# Patient Record
Sex: Male | Born: 1935 | Marital: Married
Health system: Southern US, Community
[De-identification: ages and names within clinical notes are randomized; demographics above are authoritative.]

---

## 2003-01-05 ENCOUNTER — Other Ambulatory Visit: Payer: Self-pay

## 2004-04-30 ENCOUNTER — Ambulatory Visit: Payer: Self-pay

## 2004-05-18 ENCOUNTER — Ambulatory Visit: Payer: Self-pay

## 2004-05-24 ENCOUNTER — Ambulatory Visit: Payer: Self-pay | Admitting: Internal Medicine

## 2004-09-24 ENCOUNTER — Ambulatory Visit: Payer: Self-pay

## 2004-12-12 ENCOUNTER — Inpatient Hospital Stay: Payer: Self-pay | Admitting: Cardiology

## 2004-12-12 ENCOUNTER — Other Ambulatory Visit: Payer: Self-pay

## 2004-12-13 ENCOUNTER — Other Ambulatory Visit: Payer: Self-pay

## 2005-04-19 ENCOUNTER — Ambulatory Visit: Payer: Self-pay | Admitting: Specialist

## 2005-09-10 ENCOUNTER — Ambulatory Visit: Payer: Self-pay | Admitting: Internal Medicine

## 2005-10-29 ENCOUNTER — Ambulatory Visit: Payer: Self-pay | Admitting: Specialist

## 2006-05-01 ENCOUNTER — Ambulatory Visit: Payer: Self-pay | Admitting: Specialist

## 2009-02-10 ENCOUNTER — Inpatient Hospital Stay: Payer: Self-pay | Admitting: Internal Medicine

## 2009-04-14 ENCOUNTER — Ambulatory Visit: Payer: Self-pay | Admitting: Internal Medicine

## 2009-08-19 ENCOUNTER — Emergency Department: Payer: Self-pay | Admitting: Internal Medicine

## 2009-11-24 ENCOUNTER — Ambulatory Visit: Payer: Self-pay | Admitting: Internal Medicine

## 2011-12-05 ENCOUNTER — Encounter: Payer: Self-pay | Admitting: Internal Medicine

## 2011-12-20 ENCOUNTER — Encounter: Payer: Self-pay | Admitting: Internal Medicine

## 2012-01-19 ENCOUNTER — Encounter: Payer: Self-pay | Admitting: Internal Medicine

## 2012-02-19 ENCOUNTER — Encounter: Payer: Self-pay | Admitting: Internal Medicine

## 2012-05-23 ENCOUNTER — Inpatient Hospital Stay: Payer: Self-pay | Admitting: Internal Medicine

## 2012-05-23 LAB — CK TOTAL AND CKMB (NOT AT ARMC)
CK, Total: 37 U/L (ref 35–232)
CK, Total: 29 U/L — ABNORMAL LOW (ref 35–232)
CK-MB: 0.7 ng/mL (ref 0.5–3.6)
CK-MB: 0.5 ng/mL (ref 0.5–3.6)
CK-MB: 0.6 ng/mL (ref 0.5–3.6)

## 2012-05-23 LAB — COMPREHENSIVE METABOLIC PANEL
Alkaline Phosphatase: 72 U/L (ref 50–136)
Anion Gap: 5 — ABNORMAL LOW (ref 7–16)
BUN: 19 mg/dL — ABNORMAL HIGH (ref 7–18)
Calcium, Total: 8.5 mg/dL (ref 8.5–10.1)
Co2: 28 mmol/L (ref 21–32)
Creatinine: 0.96 mg/dL (ref 0.60–1.30)
EGFR (African American): >60
EGFR (Non-African Amer.): >60
SGOT(AST): 24 U/L (ref 15–37)
Sodium: 138 mmol/L (ref 136–145)

## 2012-05-23 LAB — CBC WITH DIFFERENTIAL/PLATELET
Basophil #: 0 10*3/uL (ref 0.0–0.1)
Eosinophil %: 1.5 %
HCT: 37.2 % — ABNORMAL LOW (ref 40.0–52.0)
Lymphocyte #: 1.6 10*3/uL (ref 1.0–3.6)
MCHC: 33.4 g/dL (ref 32.0–36.0)
MCV: 96 fL (ref 80–100)
Monocyte %: 9.8 %
Neutrophil #: 7.2 10*3/uL — ABNORMAL HIGH (ref 1.4–6.5)
Platelet: 288 10*3/uL (ref 150–440)
RDW: 14.2 % (ref 11.5–14.5)

## 2012-05-23 LAB — TROPONIN I
Troponin-I: 0.02 ng/mL
Troponin-I: <0.02 ng/mL

## 2012-05-24 LAB — BASIC METABOLIC PANEL
Anion Gap: 4 — ABNORMAL LOW (ref 7–16)
BUN: 27 mg/dL — ABNORMAL HIGH (ref 7–18)
Chloride: 105 mmol/L (ref 98–107)
Co2: 30 mmol/L (ref 21–32)
Glucose: 175 mg/dL — ABNORMAL HIGH (ref 65–99)
Osmolality: 287 (ref 275–301)

## 2012-05-25 LAB — BASIC METABOLIC PANEL
Anion Gap: 5 — ABNORMAL LOW (ref 7–16)
BUN: 29 mg/dL — ABNORMAL HIGH (ref 7–18)
Calcium, Total: 9.3 mg/dL (ref 8.5–10.1)
Chloride: 104 mmol/L (ref 98–107)
Co2: 30 mmol/L (ref 21–32)
Creatinine: 0.98 mg/dL (ref 0.60–1.30)
Glucose: 186 mg/dL — ABNORMAL HIGH (ref 65–99)
Potassium: 3.9 mmol/L (ref 3.5–5.1)

## 2012-05-26 LAB — BASIC METABOLIC PANEL
Calcium, Total: 9 mg/dL (ref 8.5–10.1)
Co2: 30 mmol/L (ref 21–32)
Creatinine: 1.2 mg/dL (ref 0.60–1.30)
Osmolality: 286 (ref 275–301)
Potassium: 4.1 mmol/L (ref 3.5–5.1)

## 2012-05-27 ENCOUNTER — Encounter: Payer: Self-pay | Admitting: Internal Medicine

## 2012-05-31 ENCOUNTER — Emergency Department: Payer: Self-pay | Admitting: Emergency Medicine

## 2012-05-31 LAB — URINALYSIS, COMPLETE
Bacteria: NONE SEEN
Bilirubin,UR: NEGATIVE
Blood: NEGATIVE
Ketone: NEGATIVE
Nitrite: NEGATIVE
Ph: 5 (ref 4.5–8.0)
RBC,UR: 1 /HPF (ref 0–5)
Specific Gravity: 1.018 (ref 1.003–1.030)
Squamous Epithelial: NONE SEEN

## 2012-06-10 LAB — URINALYSIS, COMPLETE
Bacteria: NONE SEEN
Bilirubin,UR: NEGATIVE
Blood: NEGATIVE
Glucose,UR: 50 mg/dL (ref 0–75)
Leukocyte Esterase: NEGATIVE
Ph: 5 (ref 4.5–8.0)
RBC,UR: <1 /HPF (ref 0–5)
Specific Gravity: 1.017 (ref 1.003–1.030)
Squamous Epithelial: <1
WBC UR: <1 /HPF (ref 0–5)

## 2012-06-18 ENCOUNTER — Encounter: Payer: Self-pay | Admitting: Internal Medicine

## 2013-05-17 ENCOUNTER — Ambulatory Visit: Payer: Self-pay | Admitting: Cardiology

## 2013-05-17 DIAGNOSIS — I509 Heart failure, unspecified: Secondary | ICD-10-CM

## 2013-05-17 DIAGNOSIS — I2510 Atherosclerotic heart disease of native coronary artery without angina pectoris: Secondary | ICD-10-CM

## 2013-05-17 LAB — CBC WITH DIFFERENTIAL/PLATELET
BASOS ABS: 0 10*3/uL (ref 0.0–0.1)
BASOS PCT: 0.6 %
EOS ABS: 0.1 10*3/uL (ref 0.0–0.7)
EOS PCT: 0.8 %
HCT: 40 % (ref 40.0–52.0)
HGB: 13 g/dL (ref 13.0–18.0)
LYMPHS ABS: 1.3 10*3/uL (ref 1.0–3.6)
Lymphocyte %: 16.6 %
MCH: 31 pg (ref 26.0–34.0)
MCHC: 32.5 g/dL (ref 32.0–36.0)
MCV: 95 fL (ref 80–100)
MONOS PCT: 9.1 %
Monocyte #: 0.7 x10 3/mm (ref 0.2–1.0)
Neutrophil #: 5.8 10*3/uL (ref 1.4–6.5)
Neutrophil %: 72.9 %
Platelet: 240 10*3/uL (ref 150–440)
RBC: 4.2 10*6/uL — AB (ref 4.40–5.90)
RDW: 14.7 % — AB (ref 11.5–14.5)
WBC: 8 10*3/uL (ref 3.8–10.6)

## 2013-05-17 LAB — BASIC METABOLIC PANEL
Anion Gap: 0 — ABNORMAL LOW (ref 7–16)
BUN: 21 mg/dL — AB (ref 7–18)
Calcium, Total: 9.2 mg/dL (ref 8.5–10.1)
Chloride: 105 mmol/L (ref 98–107)
Co2: 33 mmol/L — ABNORMAL HIGH (ref 21–32)
Creatinine: 1.03 mg/dL (ref 0.60–1.30)
EGFR (African American): >60
EGFR (Non-African Amer.): >60
GLUCOSE: 99 mg/dL (ref 65–99)
OSMOLALITY: 279 (ref 275–301)
Potassium: 4.1 mmol/L (ref 3.5–5.1)
SODIUM: 138 mmol/L (ref 136–145)

## 2013-05-17 LAB — PROTIME-INR
INR: 1.1
Prothrombin Time: 14.1 secs (ref 11.5–14.7)

## 2013-05-17 LAB — APTT: ACTIVATED PTT: 39.1 s — AB (ref 23.6–35.9)

## 2013-05-18 ENCOUNTER — Ambulatory Visit: Payer: Self-pay | Admitting: Cardiology

## 2013-05-19 ENCOUNTER — Ambulatory Visit: Payer: Self-pay | Admitting: Hospice and Palliative Medicine

## 2013-05-23 ENCOUNTER — Observation Stay: Payer: Self-pay | Admitting: Specialist

## 2013-05-23 LAB — CBC
HCT: 38.8 % — ABNORMAL LOW (ref 40.0–52.0)
HGB: 12.8 g/dL — ABNORMAL LOW (ref 13.0–18.0)
MCH: 31.4 pg (ref 26.0–34.0)
MCHC: 33.1 g/dL (ref 32.0–36.0)
MCV: 95 fL (ref 80–100)
Platelet: 203 10*3/uL (ref 150–440)
RBC: 4.09 10*6/uL — AB (ref 4.40–5.90)
RDW: 14.9 % — ABNORMAL HIGH (ref 11.5–14.5)
WBC: 7.3 10*3/uL (ref 3.8–10.6)

## 2013-05-23 LAB — URINALYSIS, COMPLETE
BACTERIA: NONE SEEN
Bilirubin,UR: NEGATIVE
Blood: NEGATIVE
Glucose,UR: 50 mg/dL (ref 0–75)
KETONE: NEGATIVE
Leukocyte Esterase: NEGATIVE
Nitrite: NEGATIVE
Ph: 5 (ref 4.5–8.0)
RBC,UR: 2 /HPF (ref 0–5)
SPECIFIC GRAVITY: 1.026 (ref 1.003–1.030)
WBC UR: 2 /HPF (ref 0–5)

## 2013-05-23 LAB — BASIC METABOLIC PANEL
Anion Gap: 4 — ABNORMAL LOW (ref 7–16)
BUN: 17 mg/dL (ref 7–18)
CALCIUM: 8.6 mg/dL (ref 8.5–10.1)
CHLORIDE: 106 mmol/L (ref 98–107)
CREATININE: 0.95 mg/dL (ref 0.60–1.30)
Co2: 30 mmol/L (ref 21–32)
EGFR (African American): >60
EGFR (Non-African Amer.): >60
Glucose: 170 mg/dL — ABNORMAL HIGH (ref 65–99)
Osmolality: 285 (ref 275–301)
Potassium: 3.6 mmol/L (ref 3.5–5.1)
Sodium: 140 mmol/L (ref 136–145)

## 2013-05-23 LAB — TROPONIN I: TROPONIN-I: 0.05 ng/mL

## 2013-05-23 LAB — TSH: Thyroid Stimulating Horm: 1.98 u[IU]/mL

## 2013-05-24 ENCOUNTER — Ambulatory Visit: Payer: Self-pay | Admitting: Neurology

## 2013-06-08 ENCOUNTER — Inpatient Hospital Stay: Payer: Self-pay | Admitting: Internal Medicine

## 2013-06-08 LAB — CBC
HCT: 38.5 % — AB (ref 40.0–52.0)
HGB: 12.5 g/dL — ABNORMAL LOW (ref 13.0–18.0)
MCH: 30.9 pg (ref 26.0–34.0)
MCHC: 32.4 g/dL (ref 32.0–36.0)
MCV: 95 fL (ref 80–100)
PLATELETS: 277 10*3/uL (ref 150–440)
RBC: 4.04 10*6/uL — ABNORMAL LOW (ref 4.40–5.90)
RDW: 14.5 % (ref 11.5–14.5)
WBC: 11.9 10*3/uL — ABNORMAL HIGH (ref 3.8–10.6)

## 2013-06-08 LAB — BASIC METABOLIC PANEL
Anion Gap: 4 — ABNORMAL LOW (ref 7–16)
BUN: 17 mg/dL (ref 7–18)
CALCIUM: 8.7 mg/dL (ref 8.5–10.1)
CO2: 28 mmol/L (ref 21–32)
Chloride: 108 mmol/L — ABNORMAL HIGH (ref 98–107)
Creatinine: 0.82 mg/dL (ref 0.60–1.30)
EGFR (Non-African Amer.): >60
Glucose: 134 mg/dL — ABNORMAL HIGH (ref 65–99)
Osmolality: 283 (ref 275–301)
POTASSIUM: 3.6 mmol/L (ref 3.5–5.1)
Sodium: 140 mmol/L (ref 136–145)

## 2013-06-08 LAB — TROPONIN I
TROPONIN-I: 0.06 ng/mL — AB
Troponin-I: 0.06 ng/mL — ABNORMAL HIGH

## 2013-06-08 LAB — URINALYSIS, COMPLETE
BLOOD: NEGATIVE
Glucose,UR: NEGATIVE mg/dL (ref 0–75)
KETONE: NEGATIVE
Leukocyte Esterase: NEGATIVE
NITRITE: NEGATIVE
Ph: 5 (ref 4.5–8.0)
Protein: 30
RBC,UR: 3 /HPF (ref 0–5)
Specific Gravity: 1.029 (ref 1.003–1.030)
Squamous Epithelial: <1
WBC UR: 2 /HPF (ref 0–5)

## 2013-06-09 LAB — BASIC METABOLIC PANEL
ANION GAP: 4 — AB (ref 7–16)
BUN: 18 mg/dL (ref 7–18)
CALCIUM: 8.9 mg/dL (ref 8.5–10.1)
Chloride: 107 mmol/L (ref 98–107)
Co2: 30 mmol/L (ref 21–32)
Creatinine: 0.78 mg/dL (ref 0.60–1.30)
EGFR (African American): >60
EGFR (Non-African Amer.): >60
Glucose: 116 mg/dL — ABNORMAL HIGH (ref 65–99)
Osmolality: 284 (ref 275–301)
POTASSIUM: 3.8 mmol/L (ref 3.5–5.1)
SODIUM: 141 mmol/L (ref 136–145)

## 2013-06-09 LAB — CBC WITH DIFFERENTIAL/PLATELET
BASOS PCT: 0.8 %
Basophil #: 0.1 10*3/uL (ref 0.0–0.1)
Eosinophil #: 0.1 10*3/uL (ref 0.0–0.7)
Eosinophil %: 1.5 %
HCT: 37.6 % — AB (ref 40.0–52.0)
HGB: 12.6 g/dL — ABNORMAL LOW (ref 13.0–18.0)
LYMPHS ABS: 1 10*3/uL (ref 1.0–3.6)
LYMPHS PCT: 11 %
MCH: 31.8 pg (ref 26.0–34.0)
MCHC: 33.6 g/dL (ref 32.0–36.0)
MCV: 95 fL (ref 80–100)
MONOS PCT: 8.5 %
Monocyte #: 0.8 x10 3/mm (ref 0.2–1.0)
NEUTROS PCT: 78.2 %
Neutrophil #: 7.4 10*3/uL — ABNORMAL HIGH (ref 1.4–6.5)
PLATELETS: 272 10*3/uL (ref 150–440)
RBC: 3.97 10*6/uL — AB (ref 4.40–5.90)
RDW: 14.6 % — AB (ref 11.5–14.5)
WBC: 9.5 10*3/uL (ref 3.8–10.6)

## 2013-06-09 LAB — TROPONIN I: Troponin-I: 0.06 ng/mL — ABNORMAL HIGH

## 2013-06-13 LAB — CULTURE, BLOOD (SINGLE)

## 2013-06-18 ENCOUNTER — Ambulatory Visit: Payer: Self-pay | Admitting: Hospice and Palliative Medicine

## 2013-06-18 DEATH — deceased

## 2014-02-13 IMAGING — CT CT CERVICAL SPINE WITHOUT CONTRAST
2 series · 16 of 27 positions shown, 20 images · non-contrast
Comparison: none

REASON FOR EXAM: pain fall
COMMENTS:

PROCEDURE:     CT  - CT CERVICAL SPINE WO  - May 31, 2012  [DATE]
RESULT:     Comparison: None.
TECHNIQUE: Multiple axial CT images were obtained of the cervical spine,
without intravenous contrast.  Sagittal and coronal reformatted images were
constructed.

[Series 5: sagittal · sagittal · 0.37mm/px · 5 of 46 slices shown, 6 images]
[im 16/46  bone]
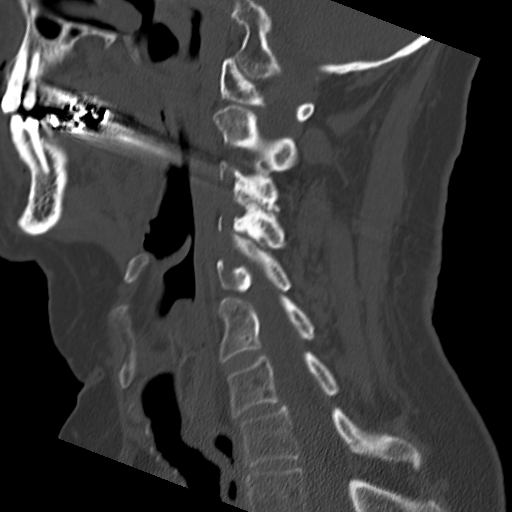
[im 19/46  bone]
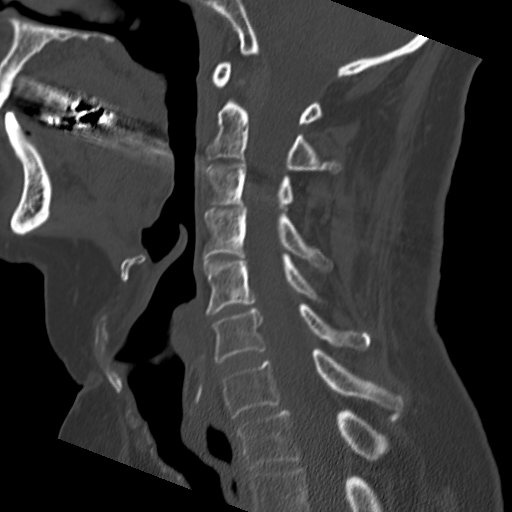
[im 23/46  soft-tissue]
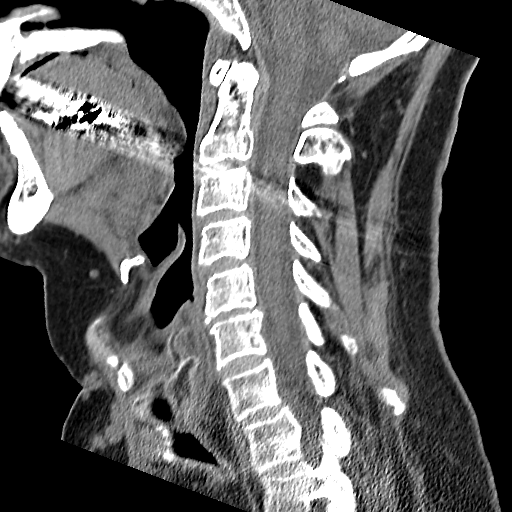
[im 23/46  bone]
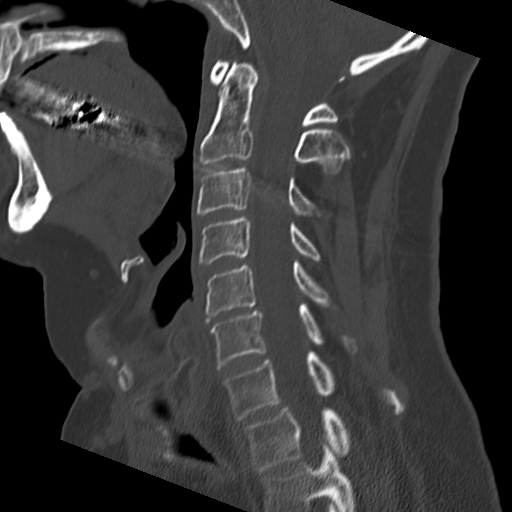
[im 27/46  bone]
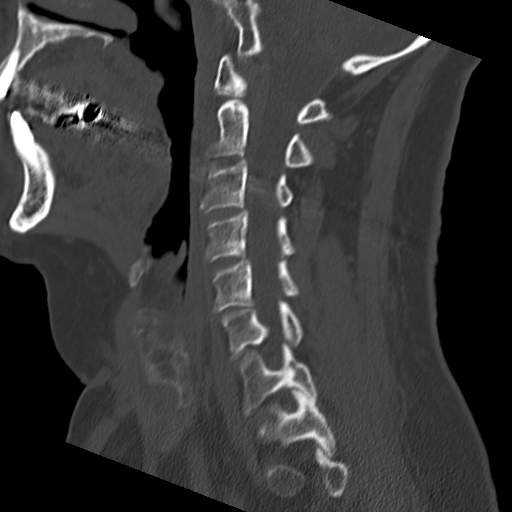
[im 31/46  bone]
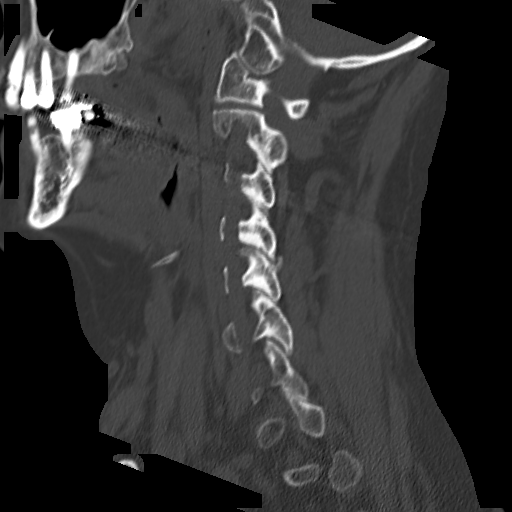

[Series 6: axial · axial · 0.33mm/px · z∈[-328,-169]mm · 11 of 100 slices shown, 14 images]
[im 8/100  soft-tissue]
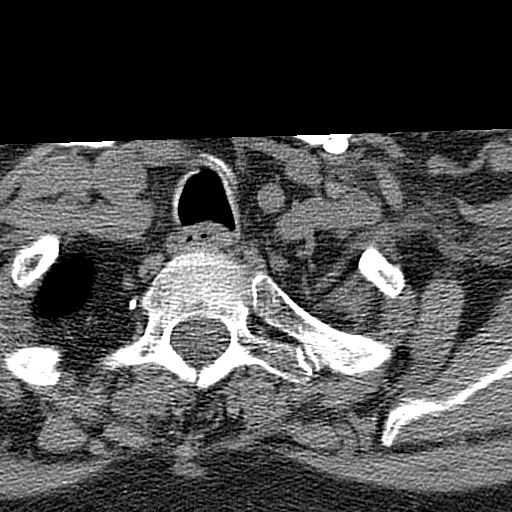
[im 8/100  bone]
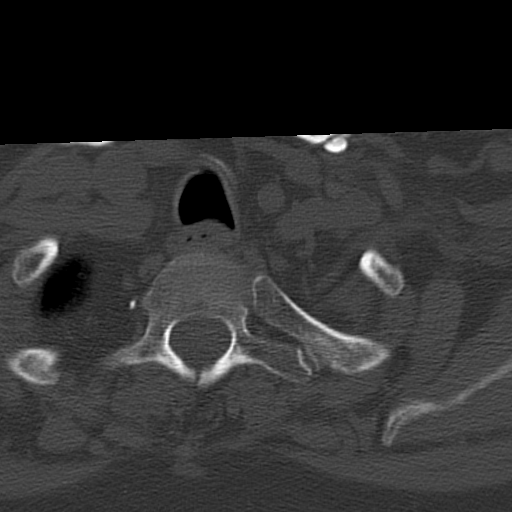
[im 16/100  bone]
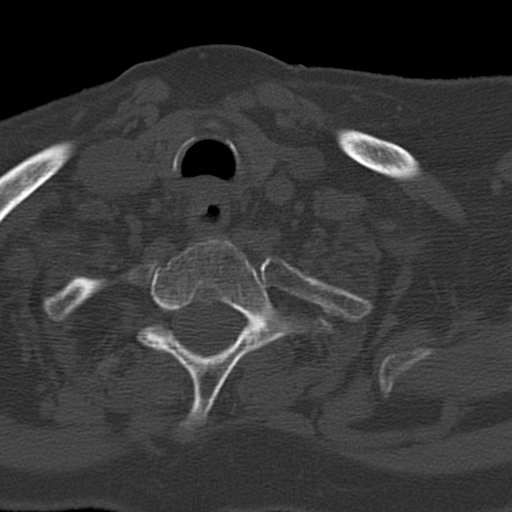
[im 23/100  bone]
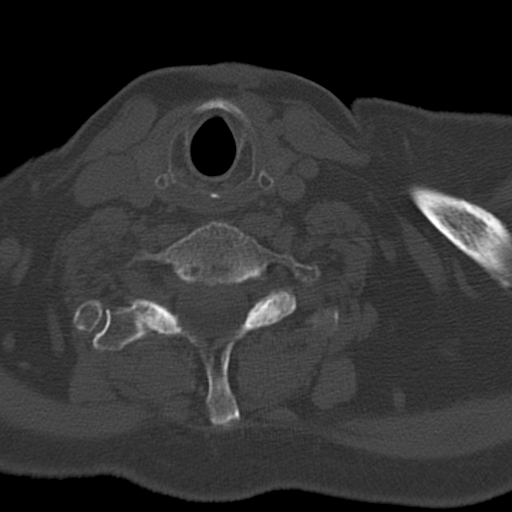
[im 31/100  bone]
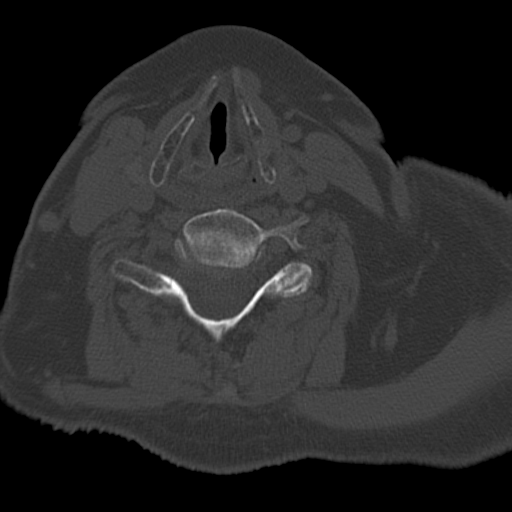
[im 39/100  soft-tissue]
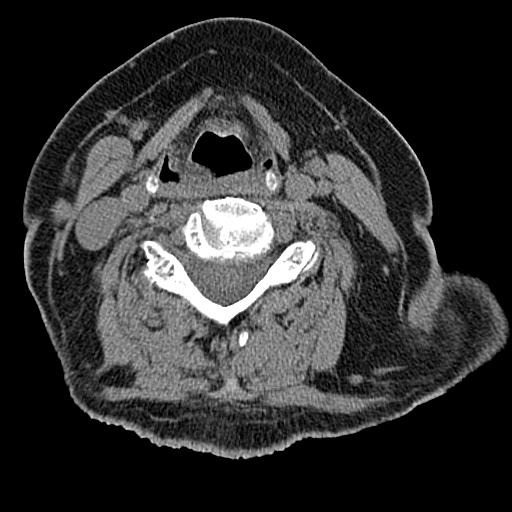
[im 39/100  bone]
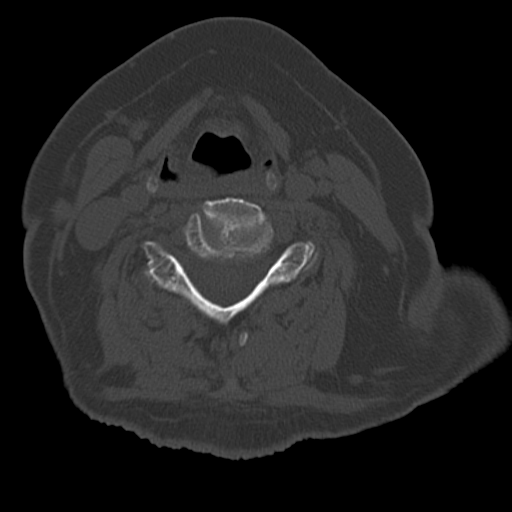
[im 54/100  bone]
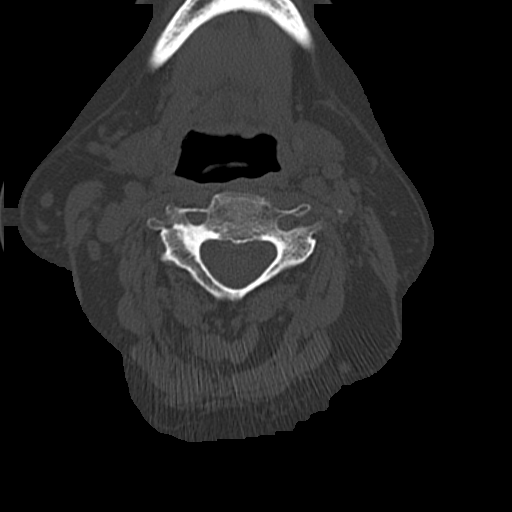
[im 61/100  bone]
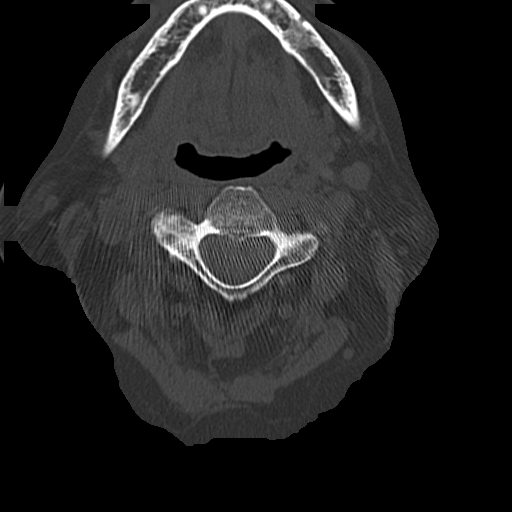
[im 69/100  bone]
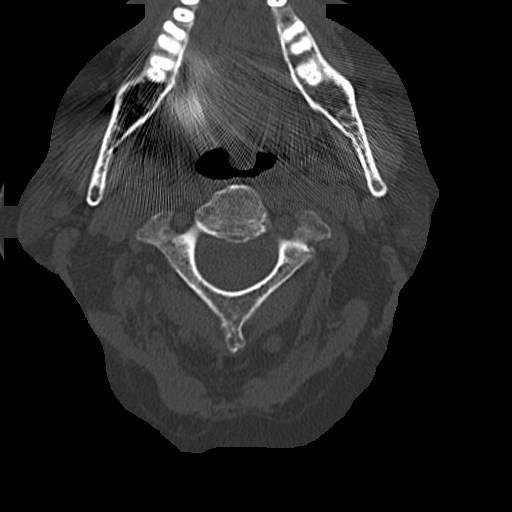
[im 77/100  soft-tissue]
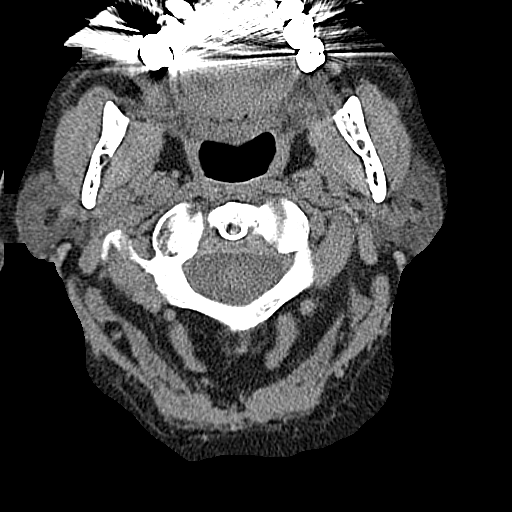
[im 77/100  bone]
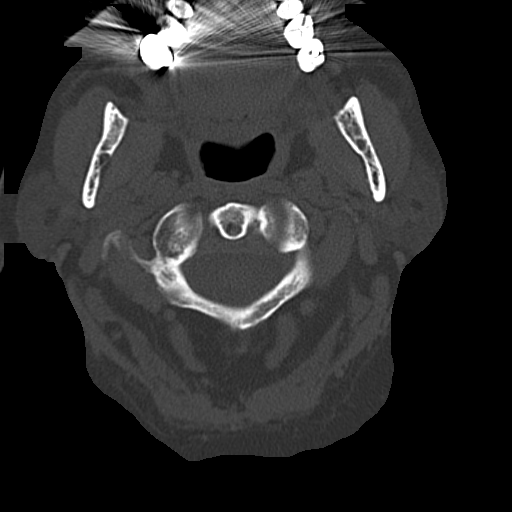
[im 84/100  bone]
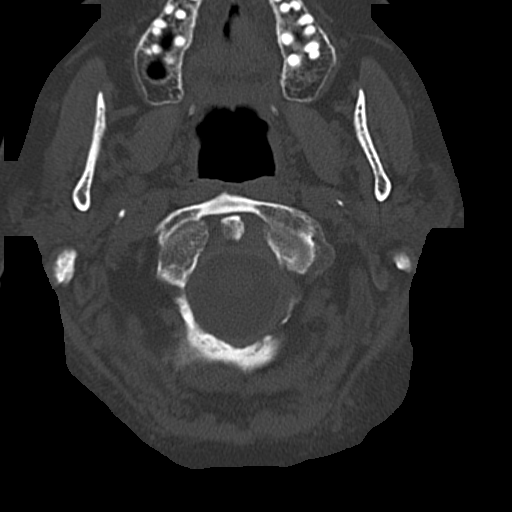
[im 92/100  bone]
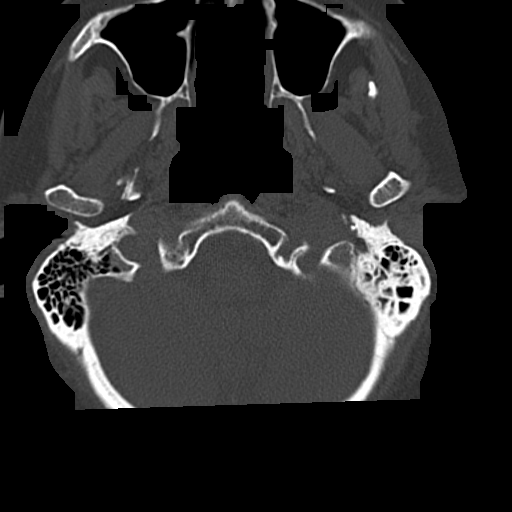

[16 of 27 positions shown; findings below may reference images not displayed]

FINDINGS: No cervical spine fracture seen. There is approximately 2 mm of
anterolisthesis of C7 on T1. Mild posterior disc bulge is seen at C6-C7.
Vertebral body heights are maintained.  Prevertebral soft tissues are within
normal limits.
IMPRESSION: 1. No cervical spine fracture seen.
2. Slight anterolisthesis of C7 on T[DATE] be degenerative. Ligamentous
injury is not excluded.

[REDACTED]

## 2014-06-10 NOTE — Consult Note (Signed)
Brief Consult Note: Diagnosis: Cough, mild elevated BNP c/w bronchitis or pneumonia, neg troponin.   Patient was seen by consultant.   Consult note dictated.   Comments: REC  Agree with current therapy, review echo, defer full dose anticoagulation, defer further cardiac diagnostics.  Electronic Signatures: Marcina MillardParaschos, Delano Scardino (MD)  (Signed 05-Apr-14 11:22)  Authored: Brief Consult Note   Last Updated: 05-Apr-14 11:22 by Marcina MillardParaschos, Sho Salguero (MD)

## 2014-06-10 NOTE — Consult Note (Signed)
PATIENT NAME:  Jose Santiago, Jose Santiago MR#:  [id number] DATE OF BIRTH:  10-Sep-1921  DATE OF CONSULTATION:  05/23/2012  PRIMARY CARE PHYSICIAN:   Randa LynnLamb CONSULTING PHYSICIAN:  Marcina MillardAlexander Dan Scearce, MD  CHIEF COMPLAINT:  Cough.  REASON FOR CONSULTATION: Consultation requested for evaluation of coronary artery disease.   HISTORY OF PRESENT ILLNESS: The patient is an 81 year old gentleman with known history of coronary artery disease status post bypass graft surgery. The patient is status post pacemaker for second-degree AV block. The patient has had a recent history of productive cough treated as an outpatient by Dr. Randa LynnLamb but presents to [facility] Emergency Room. Chest x-ray revealed possible developing pneumonia. BNP was elevated to 1800. The patient denies chest pain. Other notable admission labs included negative troponin 0.02   PAST MEDICAL HISTORY: 1.  Known coronary artery disease status post bypass graft surgery 1981.  2.  Status post pacemaker for second-degree AV block.  3. Obstructive sleep apnea on nocturnal oxygen therapy. 4.  History of hepatitis C after blood transfusion.  5.  Hyperlipidemia.   MEDICATIONS: Simvastatin 20 mg daily, metoprolol 25 mg b.i.d., Imdur 1 daily, hydrochlorothiazide 12.5 mg daily, Xanax p.r.n., omeprazole 20 mg daily.   SOCIAL HISTORY:  The patient denies tobacco or EtOH abuse.   FAMILY HISTORY:  No immediate family history of coronary artery disease or myocardial infarction.  REVIEW OF SYSTEMS:  CONSTITUTIONAL:  No fever or chills.  EYES:  No blurry vision.  EARS:  No hearing loss.   RESPIRATORY:  No shortness of breath. The patient does have cough.  CARDIOVASCULAR:  The patient denies chest pain.  GASTROINTESTINAL:  No nausea, vomiting or diarrhea.  GENITOURINARY:  No dysuria or hematuria.  ENDOCRINE:  No polyuria or polydipsia.  MUSCULOSKELETAL:  No arthralgias or myalgias.  NEUROLOGICAL:  No focal muscle weakness or numbness.  PSYCHOLOGICAL:  No depression  or anxiety.   PHYSICAL EXAMINATION: VITAL SIGNS: Blood pressure 125/73, pulse 75, respirations 18, pulse ox 98%.  HEENT:  Pupils equal, reactive to light and accommodation.  NECK:  Supple without thyromegaly.  LUNGS:  Clear.  CARDIOVASCULAR: Normal JVP. Normal PMI. Regular rate and rhythm. Normal S1, S2. No appreciable gallop, murmur or rub.  ABDOMEN: Soft and nontender. Pulses were intact bilaterally.  MUSCULOSKELETAL:  Normal muscle tone.  NEUROLOGIC:  The patient is alert and oriented x 3. Motor and sensory both grossly intact.   IMPRESSION:  An 81 year old gentleman with known coronary artery disease status post bypass graft surgery who presents with cough consistent with bronchitis or early pneumonia with negative troponin.   RECOMMENDATIONS: 1.  Continue present medications.  2.  Would defer full dose anticoagulation.  3.  Defer further cardiac workup at this time.     ____________________________ Marcina MillardAlexander Donnabelle Blanchard, MD ap:ce D: 05/23/2012 11:21:04 ET T: 05/23/2012 11:56:45 ET JOB#: [id number]  cc: Marcina MillardAlexander Analysa Nutting, MD, <Dictator> Marcina MillardALEXANDER Alvina Strother MD ELECTRONICALLY SIGNED 05/26/2012 12:40

## 2014-06-10 NOTE — H&P (Signed)
PATIENT NAME:  Jose Santiago, Jose Santiago MR#:  [id number] DATE OF BIRTH:  09/15/1921  DATE OF ADMISSION:  05/23/2012  PRIMARY CARE PHYSICIAN: Reola Mosher. Randa Lynn, MD  CHIEF COMPLAINT: Cough, shortness of breath.   HISTORY OF PRESENT ILLNESS: The patient is a pleasant 81 year old white male with a past medical history of coronary artery disease, status post CABG in 1981, obstructive sleep apnea, history of second-degree AV block, status post permanent pacemaker placement, with seasonal allergies, who presented to the Emergency Department with complaints of cough, shortness of breath for the last few days. The patient states unable to lie down flat, starts to have coughing spell to the point of passing out. Concerning this, contacted his primary care physician who felt this was from the seasonal allergies. Last night, the patient started to experience severe shortness of breath. Did not have any chest pain, palpitations. Did not have any fever. However, has been having yellowish-greenish sputum. Workup in the Emergency Department revealed has elevated BNP of 1800. The patient walks small distances around the house. Usually feels fatigued; the patient states no more than usual at this time. Chest x-ray showed coarse lung markings consistent with atelectasis or developing pneumonia.   PAST MEDICAL HISTORY:  1. Coronary artery disease, status post CABG in 1981.  2. Second-degree AV block, status post pacemaker placement.  3. Obstructive sleep apnea, on nocturnal oxygen 2 liters per minute.  4. History of HCV diagnosed after the blood transfusion.  5. Anxiety.  7. Constipation.   ALLERGIES: PENICILLIN.   HOME MEDICATIONS:  1. Xanax orally as needed.  2. Simvastatin 20 mg 1 tablet once a day.  3. Omeprazole 20 mg once a day.  4. Metoprolol 25 mg 2 times a day.  5. Imdur 1 tablet once a day.  6. Hydrochlorothiazide 12.5 mg once a day.   SOCIAL HISTORY: No history of smoking, drinking alcohol or using illicit  drugs.   FAMILY HISTORY: Negative for coronary artery disease and MI.  REVIEW OF SYSTEMS: CONSTITUTIONAL: Denies any fever. Feels generalized weakness secondary to lack of sleep in the last 2 to 3 days.  EYES: No blurred vision. Has discharge from the eyes.  ENT: Has mild sore throat, cough. Feels sinus congestion.   RESPIRATORY: Has cough, shortness of breath.  CARDIOVASCULAR: No chest pain, palpitations.  GASTROINTESTINAL: No nausea or vomiting.  GENITOURINARY: No dysuria or hematuria  ENDOCRINE: No polyuria or polydipsia.  SKIN: No rash or lesions.  MUSCULOSKELETAL: Denies any muscle weakness or myalgias.  NEUROLOGIC: Has resting tremor, slow speech.  PSYCHIATRIC: No depression.   PHYSICAL EXAMINATION:  GENERAL: This is a well-built, well-nourished, age-appropriate male lying down in the bed, not in distress.  VITAL SIGNS: Temperature 98.3, pulse 62, blood pressure 139/64, respiratory rate of 18, oxygen saturation 95% on room air.  HEENT: Head normocephalic, atraumatic. Eyes: No scleral icterus. Conjunctivae red, the patient states from the allergies. Extraocular movements are intact. Pupils equal and reactive to light. No pharyngeal erythema. Tympanic membranes: No bulging.  NECK: Supple. No lymphadenopathy. No JVD. No carotid bruit. No thyromegaly.  CHEST: Has no focal tenderness. Has end-expiratory wheezes, bibasilar crackles on both sides.  HEART: S1 and S2 regular. No murmurs are heard.  ABDOMEN: Bowel sounds present. Soft, nontender, nondistended. No hepatosplenomegaly.  EXTREMITIES: Trace pitting edema around the ankles.  MUSCULOSKELETAL: Good range of motion in all the extremities.  NEUROLOGIC: The patient had masked facies, resting tremors. No sensory deficits. Motor 5/5 in upper and lower extremities. Slightly increased reflexes.  SKIN: No rash or lesions.   LABORATORIES:  1. CMP is completely within normal limits.  2. Chest x-ray, 1-view portable: Possible low-grade  compensated congestive heart failure. Coarse lung markings in the retrocardiac region, likely on the left, consistent with subsegmental atelectasis or developing pneumonia.  3. Troponins are less than 0.02. 4. CBC: WBC of 9.9, hemoglobin 12.4, platelet count of 388, neutrophils of 72%.  5. BNP 1800.  6. EKG, 12-lead: Normal sinus rhythm with no ST-T wave abnormalities.   ASSESSMENT AND PLAN: The patient is an 81 year old male with known history of coronary artery disease, had CABG in 1981, who comes to the Emergency Department with cough, especially lying down flat, associated with coughing spells and shortness of breath.   1. Cough. This seems to be more from the postnasal drip; however, the patient has possibly airway reactive disease which is causing him to have end-expiratory wheezes. Continue with the breathing treatments. Keep the patient on prednisone for 5 days. Also keep the patient on intranasal steroids. The patient has normal white blood cell count, no fever. However, concerning about greenish sputum, will add the Rocephin.  2. Congestive heart failure. This could be from either stress, from the cough, congestion. The patient does have bibasilar crackles. Will treat it as congestive heart failure. Will also obtain echocardiogram. Consult cardiology.  3. Coronary artery disease. No current issues at this time. 4. Questionable early Parkinson's disease in this patient. The patient has masked facies, shuffling gait, increased tone.  5. Debility. Will involve the physical therapy.  6. Keep the patient on deep vein thrombosis prophylaxis with Lovenox.   TIME SPENT: 45 minutes.     ____________________________ Susa GriffinsPadmaja Brittley Regner, MD pv:OSi D: 05/23/2012 08:37:48 ET T: 05/23/2012 09:11:05 ET JOB#: [id number]  cc: Susa GriffinsPadmaja Felissa Blouch, MD, <Dictator> Reola MosherAndrew S. Randa LynnLamb, MD Clerance LavPADMAJA Annelise Mccoy MD ELECTRONICALLY SIGNED 05/26/2012 6:32

## 2014-06-10 NOTE — Discharge Summary (Signed)
PATIENT NAME:  Jose Santiago, Jose Santiago MR#:  [id number] DATE OF BIRTH:  April 20, 1921  DATE OF ADMISSION:  05/23/2012 DATE OF DISCHARGE:  05/26/2012   ADMITTING DIAGNOSIS: Cough, congestive heart failure.   DISCHARGE DIAGNOSES: 1.  Congestive heart failure, left heart, acute on chronic, diastolic.  2.  Mild mitral regurgitation, tricuspid regurgitation as well as mild pulmonary hypertension on echocardiogram done during this admission.  3.  Left base bacterial pneumonia.  4.  Anemia.  5.  Generalized weakness. 6.  History of coronary artery disease, status post coronary artery bypass grafting in the past.  7.  History of AV block. 8.  Status post permanent pacemaker placement. 9.  History of obstructive sleep apnea on oxygen therapy at 2 liters of oxygen through nasal cannula at night. 10.  History of C virus after blood transfusion in the past. 11.  History of anxiety, constipation as well as hyperlipidemia.   DISCHARGE CONDITION: Stable.   DISCHARGE MEDICATIONS: The patient is to continue simvastatin 10 mg p.o. at bedtime. Isosorbide mononitrate 30 mg p.o. daily.  Omeprazole 10 mg p.o. daily. Xanax unknown dose, daily as needed. Lasix 10 mg p.o. daily. Lisinopril 2.5 mg p.o. daily at bedtime.  Docusate sodium 100 mg p.o. twice daily as needed.  Senna 1 tablet once daily at bedtime as needed.  Fluticasone 2 sprays once daily.  Combivent Respimat 1 puff 4 times daily.  Metoprolol tartrate 12.5 mg p.o. twice daily.  Levofloxacin 250 mg p.o. once daily for 5 more days. Prednisone taper 40 mg p.o. once on 03/29/2012, then taper x 10 mg daily until stopped. The patient is not to take hydrochlorothiazide. Home oxygen: 2 liters of oxygen through nasal cannula at bedtime.  DIET:  2-gram salt, low fat, low cholesterol diet. Diet consistency regular.   ACTIVITY LIMITATIONS:  As tolerated.    FOLLOW UP:  Follow-up appointment with Dr. Randa LynnLamb in 2 days after discharge.   CONSULTANTS: Dr. Darrold JunkerParaschos, care  management.   RADIOLOGIC STUDIES: Chest x-ray PA and lateral on 05/25/2012 showed no overt evidence of pulmonary edema, cannot exclude low-grade compensated congestive heart failure. Coarse lung markings in the retrocardiac region likely on the left are consistent with subsegmental atelectasis or developing pneumonia.  Follow-up x-rays were recommended. Chest x-ray, PA and lateral, 05/24/2012, revealed mild infiltrate in the left lung base. No overt pulmonary alveolar edema or pleural effusion was noted.   HOSPITAL COURSE:  The patient is an 81 year old Caucasian male with past medical history significant for history of obstructive sleep apnea, history of coronary artery disease, who presented to the hospital with complaints of cough as well as shortness of breath. Please refer to Dr. Clarita LeberVasireddy's admission note on 03/25/2012. On arrival to the hospital, temperature is 98.3, pulse was 62, respiratory rate was 18, blood pressure was 139/64, saturation was 95% on room air.  Physical exam was remarkable for bibasilar crackles in both lungs as well as end expiratory wheezes. Chest x-ray revealed low-grade compensated congestive heart failure as well as possible developing pneumonia.   LABORATORY DATA:  On day of admission 05/23/2012, revealed elevated beta natriuretic peptide of 1366, glucose 115, BUN 19.  Otherwise BMP was unremarkable. The patient's albumin level was 3.0. Cardiac enzymes x 3 were within normal limits. White blood cell count was normal at 9.9, hemoglobin was 12.4, platelet count 288 with elevated absolute neutrophil count of 7.2. EKG showed AV sequential dual chamber electronic pacemaker at 64 beats, nonspecific ST-T changes were noted.   The patient was  admitted to telemetry. His cardiac enzymes were cycled. He was started on diuretics as well as antibiotics for pneumonia. He was also initiated on high doses of steroids due to concerns of an element of COPD exacerbation.  With this therapy, he  significantly improved. By the day of discharge, 05/26/2012, he was able to be weaned off oxygen and he feels good at rest as well as with exertion and his lungs sounded clear.    On the day of discharge, 05/26/2012, the patient's vital signs:  Temperature is 97.8, pulse was 62 respiratory rate was 18, blood pressure 135/67, saturation was 94% on room air.   In regards to acute on chronic congestive heart failure, the patient had echocardiogram done, as mentioned above while he was in the hospital.  The patient's echocardiogram revealed left ventricular ejection fraction by visual estimation is 50% to 55%, low normal, with global left ventricular systolic function, mild mitral valvular regurgitation, mild elevated pulmonary arterial systolic pressure, mild tricuspid regurgitation. The patient was also evaluated by cardiologist, Dr. Darrold Junker who reviewed the echocardiogram and recommended to defer further cardiac  diagnostics.  The patient is to continue Lasix daily. He was restarted on ACE inhibitor, low dose of ACE inhibitor, lisinopril at 2.5 mg p.o. daily dose. It is recommended to follow the patient's blood pressure readings very closely as he had relative hypotension while in the hospital. He may need to have his Lasix discontinued at some point or given intermittently depending on when it is needed.   In regard to pneumonia, the patient is to continue antibiotics for 5 more days to complete course. He also is to continue Combivent Respimat as needed as well as fluticasone for suspected mild COPD exacerbation.   In regards to anemia, the patient was noted to be slightly anemic in the hospital; however, the patient's anemia was stable.  Initially on arrival to the hospital, the patient's hemoglobin level was 12.4, it remained stable. On 040/08/2012, the patient's hemoglobin level was 13.1.   It is recommended to follow the patient's hemoglobin as an outpatient and make decisions about therapies if  needed.   Regarding generalized weakness, the patient was evaluated by the physical therapist while in the hospital, who recommended short-term rehabilitation where he will likely be discharged today.  For his chronic medical problems such as obstructive sleep apnea, coronary artery disease, anxiety, constipation, hyperlipidemia the patient is to continue his outpatient management. He is being discharged in stable condition with the above-mentioned medications and follow-up.   TIME SPENT: 40 minutes.    ____________________________ Katharina Caper, MD rv:ct D: 05/26/2012 10:45:59 ET T: 05/26/2012 11:31:51 ET JOB#: [id number]  cc: Katharina Caper, MD, <Dictator> Reola Mosher. Randa Lynn, MD Katharina Caper MD ELECTRONICALLY SIGNED 06/06/2012 21:22

## 2014-06-11 NOTE — H&P (Signed)
PATIENT NAME:  Jose Santiago, Jose Santiago MR#:  [id number] DATE OF BIRTH:  May 03, 1921  DATE OF ADMISSION:  06/08/2013  PRIMARY CARE PROVIDER: Dr. Kandyce Rud  EMERGENCY DEPARTMENT REFERRING PHYSICIAN: Dr. Zenda Alpers  CHIEF COMPLAINT: Shortness of breath, cough.   HISTORY OF PRESENT ILLNESS: The patient is an 80 year old white male with progressive Parkinson disease who was recently hospitalized on 04/05 and discharged on 04/08 for progressive weakness, difficulty with ambulation. The patient has been at home with his wife and started developing a cough since last Wednesday. The patient has had a productive cough and severe. He also started having shortness of breath. According to his wife, he also is coughing with eating. He has not been able to eat much and drink much. He also had a fever 2 days ago, but now his temperature has been low, according to her. Has been having chills. Has not had any chest pains. He is mildly confused and is unable to give me any history.   PAST MEDICAL HISTORY:  1.  Parkinson disease. 2.  Sick sinus syndrome, status post pacemaker placement.  3.  Coronary artery disease, status post bypass.  4.  Hepatitis C.  5.  Hyperlipidemia.  6.  Anxiety.   ALLERGIES: CARBIDOPA AND PENICILLIN.   SOCIAL HISTORY: No smoking. No alcohol abuse. No illicit drug use. Lives at home with his wife.   FAMILY HISTORY: Both mother and father are deceased. Mother died from a stroke. Father died from lung cancer.   MEDICATIONS: Aspirin 81 mg 1 tab p.o. daily, clarithromycin 250 b.i.d., Colace 50 b.i.d., Lasix 20 daily.  REVIEW OF SYSTEMS: Unobtainable due to the patient being confused as well as hard to understand him.   PHYSICAL EXAMINATION: VITAL SIGNS: Temperature 98.4, pulse 64, respirations 20, blood pressure 155/70, O2 98%.  GENERAL: The patient is a chronically ill-appearing male having significant amount of coughing.  HEENT: Head atraumatic, normocephalic. Extraocular movements intact.  Pupils equally round and reactive to light and accommodation. Sclerae anicteric. No conjunctival pallor. No erythema. Oropharynx is dry. External ear exam shows no drainage or erythema. Nose exam shows no drainage or erythema.  NECK: Supple. There is no JVD. No bruits. No lymphadenopathy. No thyromegaly.  HEART: Regular rate and rhythm. No murmurs, rubs, clicks, or gallops.  LUNGS: Bilateral rhonchi throughout both lungs. No wheezing. No rales. No accessory muscle usage.  ABDOMEN: Soft, nontender, nondistended. Positive bowel sounds x4.  EXTREMITIES: No clubbing, cyanosis. 1+ pitting edema. Good DP and PT pulses.  NEUROLOGIC: The patient is currently a little confused, able to move all extremities. Does have some tremor.  SKIN: A little dry but no rashes.  LYMPHATICS: No lymph nodes palpable.  PSYCHIATRIC: Not anxious or depressed.  VASCULAR: Good DP and PT pulses.  DIAGNOSTIC DATA:  Labs: Glucose 134, BUN 17, creatinine 0.82, sodium 140, potassium 3.6, chloride 108, CO2 28, calcium 8.7. Troponin 0.06. WBC 11.9, hemoglobin 12.5, platelet count 277,000. Urinalysis is negative.   Chest x-ray shows bibasilar airspace opacities. May reflect atelectasis or infiltrates.  EKG shows ventricular paced rhythm.   ASSESSMENT AND PLAN: The patient is an 81 year old white male with history of progressive Parkinson disease, presents with cough and shortness of breath.  1.  Shortness of breath and cough likely due to pneumonia, possible aspiration. At this time, we will treat him with IV Levaquin. He will need a swallow evaluation. I am going to hold all p.o. medications for the time being until swallow evaluation is done.  2.  Parkinson  disease which is progressive. He has not been able to tolerate some Parkinson medications in the past.  3.  Coronary artery disease. Aspirin once swallow evaluation done.   CODE STATUS: Discussed this with his wife, Jose Santiago. She is agreeable to a DNR status.    TIME  SPENT ON HISTORY AND PHYSICAL: 50 minutes.   ____________________________ Lacie ScottsShreyang H. Allena KatzPatel, MD shp:sb D: 06/08/2013 08:23:42 ET T: 06/08/2013 08:37:43 ET JOB#: [id number]  cc: Yukie Bergeron H. Allena KatzPatel, MD, <Dictator> Charise CarwinSHREYANG H Euriah Matlack MD ELECTRONICALLY SIGNED 06/10/2013 13:50

## 2014-06-11 NOTE — Op Note (Signed)
PATIENT NAME:  Jose Santiago, Jose Santiago MR#:  [id number] DATE OF BIRTH:  14-Oct-1921  DATE OF PROCEDURE:  05/18/2013  PRIMARY CARE PHYSICIAN:  Kandyce RudMarcus Babaoff, MD  PREPROCEDURE DIAGNOSES:  1.  Sick sinus syndrome.  2.  Elective replacement indication.   PROCEDURE: Dual-chamber pacemaker generator change out.   SURGEON:  Marcina MillardAlexander Teyah Rossy, MD  POSTPROCEDURE DIAGNOSIS: Intermittent ventricular pacing.   INDICATION: The patient is an 81 year old gentleman status post dual-chamber pacemaker for sick sinus syndrome. Recent pacemaker interrogation has shown that the pacemaker is at elective replacement indication with a slow ventricular escape rate.   DESCRIPTION OF PROCEDURE: The risks, benefits and alternatives of pacemaker generator change-out explained to the patient and informed written consent was obtained.   DESCRIPTION OF PROCEDURE: He was brought to the Operating Room in a fasting state. The left pectoral region was prepped and draped in the usual sterile manner. Anesthesia was obtained with 1% Xylocaine locally. A 6 cm incision was performed over the left pectoral region. The pacemaker generator was retrieved by electrocautery and blunt dissection. The leads were disconnected from the pacemaker generator and connected to the new pacemaker generator, dual-chamber rate-responsive generator ([employer]tronic Adapta [id number]). The pacemaker pocket was irrigated with gentamicin solution. The new pacemaker generator was positioned in the pocket and the pocket was closed with 2-0 and 4-0 Vicryl respectively. Steri-Strips and pressure dressing were applied.    ____________________________ Marcina MillardAlexander Aijah Lattner, MD ap:cs D: 05/18/2013 14:21:16 ET T: 05/18/2013 14:43:19 ET JOB#: [id number]  cc: Marcina MillardAlexander Lashona Schaaf, MD, <Dictator> Marcina MillardALEXANDER Lometa Riggin MD ELECTRONICALLY SIGNED 05/22/2013 13:17

## 2014-06-11 NOTE — Discharge Summary (Signed)
PATIENT NAME:  Jose Santiago, Jose Santiago MR#:  [id number] DATE OF BIRTH:  04/12/1921  DATE OF ADMISSION:  06/08/2013 DATE OF DISCHARGE:  06/10/2013  ADMITTING DIAGNOSIS: Aspiration pneumonitis.   DISCHARGE DIAGNOSES: 1.  Aspiration pneumonitis.  2.  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  3.  Generalized weakness.   4.  History of sick sinus syndrome, status post pacemaker placement.  5.  History of hepatitis C.  6.  History of coronary artery disease, status post coronary artery bypass grafting. 7.  Hyperlipidemia. 8.  Anxiety.  DISPOSITION: The patient is being discharged to hospice home.   CONSULTANTS: Care management, social work, palliative care.   RADIOLOGIC STUDIES: Chest x-ray, portable single view, on the 23rd of April, 2015, revealed bibasilar airspace opacities, may reflect atelectasis in this low inspiratory portable examination, according to radiology. CT scan of head without contrast on the 23rd of April, 2015, revealed no acute intracranial process, stable appearance of the head, involutional changes, white matter changes suggesting chronic small vascular ischemic disease, similar to before.   HISTORY OF PRESENT ILLNESS:  The patient is an 81 year old Caucasian male with history of Parkinson's disease who is intolerant to Parkinson's medications, and he has been off Parkinson's medications for approximately a month now, comes to the hospital with complaints of shortness of breath, as well as cough. Please refer to Dr. Serita GritShreyang Patel's admission on the 21st of April, 2015. On arrival to the hospital, the patient's temperature was 98.4, pulse was 64, respiration rate was 20, blood pressure 155/70, saturation was 98% on oxygen therapy. Physical exam revealed bilateral rhonchi throughout both lungs, but no wheezing, no rales or accessory muscle use. The patient did have some swelling in his lower extremities. The patient's lab data done on admission revealed  elevation of glucose to 134, otherwise, BMP was unremarkable. Troponin was elevated at 0.06 on the first, as well as on the subsequent 2 more sets. White blood cell count was elevated to 11.9, hemoglobin was 12.5, platelet count 277. Blood cultures taken on the 21st of April, 2015 showed no growth. Urinalysis revealed 3 red blood cells, 2 white blood cells, trace bacteria, negative for nitrites or leukocyte esterase. Chest x-ray was concerning for possible pneumonia.   HOSPITAL COURSE:  The patient was admitted to the hospital for further evaluation and with diagnosis of aspiration pneumonitis. He was consulted by palliative care, and the patient's family decided on hospice home placement. The patient is being placed to hospice home today on 23 of April, 2015. His vital signs are stable with temperature of 98.2, pulse was 64, respiration rate was 18, blood pressure 167/86, saturation was 94% on 2 liters of oxygen via nasal cannula, 87% on room air.   DISCHARGE MEDICATIONS:  The patient's medication list is as follows: Morphine 20 mg in 1 mL oral concentrate, 0.25 mL every two hours as needed, Glycopyrrolate 1 mg tablets 3 times daily as needed. The patient is not to take omeprazole, furosemide, aspirin or Tylenol.   ACTIVITY: As tolerated.   DIET:  As tolerated.   TIME SPENT: 40 minutes on this patient.   ____________________________ Katharina Caperima Xaidyn Kepner, MD rv:aj D: 06/10/2013 17:43:09 ET T: 2013-10-20 05:11:48 ET JOB#: [id number]  cc: Katharina Caperima Chavis Tessler, MD, <Dictator> Eduardo Wurth MD ELECTRONICALLY SIGNED 06/19/2013 18:51

## 2014-06-11 NOTE — Consult Note (Signed)
PATIENT NAME:  Jose JourdainLANE, Bonner E MR#:  [id number] DATE OF BIRTH:  02-09-1922  DATE OF CONSULTATION:  05/24/2013  CONSULTING PHYSICIAN:  Pauletta BrownsYuriy Donielle Kaigler, MD  Information is obtained from the patient's chart and the patient's family who were at the bedside, specifically the patient's wife and the patient's son. This is an 81 year old gentleman brought into the hospital due to difficulty ambulating. At baseline, the patient uses a walker to ambulate, as per wife and son, who is at bedside. They stated that past 4 or 5 days he has been deteriorating  and difficulty ambulating and increased weakness in lower extremities to the point where he cannot ambulate. The patient has suspected diagnosis of Parkinson disease. He is being followed by Dr. Sherryll BurgerShah. The patient has been prescribed Sinemet. He took 1 tablet, which he states he is allergic to because he had chest pain after which and it has been discontinued. As per family, the patient also has a pacemaker, which was revised about 1 week ago.   REVIEW OF SYSTEMS:  CONSTITUTIONAL:  No fever, generalized weakness.  EYES:  No blurry vision, double vision.  ENT:  No tinnitus or postnasal drip.  GENITOURINARY: No dysuria or hematuria.  SKIN:  No rashes or lesions.  MUSCULOSKELETAL: No arthritis, swelling, pain. No numbness or tingling.  PSYCHIATRIC:  No anxiety. No insomnia.   FAMILY HISTORY: Mother died of a stroke. Father died of lung cancer.   SOCIAL HISTORY: No smoking. No EtOH abuse. Lives at home with his wife.   PAST MEDICAL HISTORY: Parkinson disease, sick sinus syndrome, has a pacemaker, coronary artery disease, status post bypass, history of hep C, hyperlipidemia and anxiety.   LABORATORY DATA:  Results have been reviewed.   PHYSICAL EXAMINATION:  VITAL SIGNS:  Temperature 97.8, pulse 60, respirations 18, blood pressure 144/75.  NEUROLOGIC:  The patient is alert, awake to the name of the city, not the name of the hospital, to month, but not to  date. Speech appears to be slightly dysarthric. Cranial nerve examination: Extraocular movements are intact. Pupils 3 mm to 2 mm, reactive bilaterally. Facial sensation intact. Facial motor is intact. Uvula elevates symmetrically. Shoulder shrug is intact. Motor strength 4/5 upper extremities, 4/5 right lower extremity and 3/5 left lower extremity with chronic weakness. Reflexes are diminished. Sensation intact. Coordination intact. Gait not assessed.   IMPRESSION: An 81 year old male with a history of Parkinson disease, presenting with generalized weakness. I am not convinced his current weakness is parkinsonian related. The patient has a very minimal resting tremor, but he does have chronic weakness of his left lower extremity, which has been there for over a year.   PLAN: At this point, would not start parkinsonian medication. The patient has to see Dr. Sherryll BurgerShah as an outpatient or what I have suggested to the family to see movement disorder clinic as an outpatient. The patient's family does not feel safe for the patient to be discharged to their home because his wife has difficulty taking care of him. Would recommend short or acute term rehab, if possible, or visiting nurse service if possible. The patient has edema of bilateral lower extremities.   Thank you; it was a pleasure seeing this patient.   ____________________________ Pauletta BrownsYuriy Treesa Mccully, MD yz:dmm D: 05/24/2013 18:05:59 ET T: 05/24/2013 19:34:44 ET JOB#: [id number]  cc: Pauletta BrownsYuriy Fredrica Capano, MD, <Dictator> Pauletta BrownsYURIY Raif Chachere MD ELECTRONICALLY SIGNED 06/16/2013 11:30

## 2014-06-11 NOTE — H&P (Signed)
PATIENT NAME:  Jose Santiago, MULVEHILL MR#:  [id number] DATE OF BIRTH:  1921/06/25  DATE OF ADMISSION:  05/23/2013  PRIMARY CARE PHYSICIAN: Kandyce Rud, MD  NEUROLOGIST: Hemang K. Sherryll Burger, MD  CHIEF COMPLAINT: Generalized weakness and difficulty walking.   HISTORY OF PRESENT ILLNESS: This is an 81 year old male who was brought into the hospital by his wife due to difficulty ambulating and generalized weakness. The patient has a history of Parkinson's disease. He follows with Dr. Sherryll Burger. The patient was started on Sinemet a while back, but had some side effects and could not tolerate it and, therefore, has not been taking anything. His parkinsonian symptoms have been progressively getting worse. Today, and also in the past few days, his wife says that he has had more difficulty getting up out of bed and doing his little activity. She was barely able to get him out of bed today and he was not able to stand up at all. Here in the Emergency Room, it took 2 people to get him up to the side of the bed. Due to his profound weakness and difficulty walking, hospitalist services were contacted for further treatment and evaluation.   REVIEW OF SYSTEMS:   CONSTITUTIONAL: No documented fever. Positive generalized weakness. No weight gain, no weight loss.  EYES: No blurred or double vision.  ENT: No tinnitus or postnasal drip. No redness of the oropharynx.  RESPIRATORY: No cough, no wheeze, no hemoptysis, no dyspnea.  CARDIOVASCULAR: No chest pain. No orthopnea, no palpitations, no syncope.  GASTROINTESTINAL: No nausea, no vomiting, no diarrhea, no abdominal pain. No melena or hematochezia.  GENITOURINARY: No dysuria or hematuria.  ENDOCRINE: No polyuria or nocturia. No heat or cold intolerance. HEMATOLOGIC: No anemia. No bruising. No bleeding.  INTEGUMENTARY: No rashes or lesions.  MUSCULOSKELETAL: No arthritis, no swelling, no gout.  NEUROLOGIC: No numbness or tingling. No ataxia. No seizure-type activity.   PSYCHIATRIC: No anxiety. No insomnia. No add.    PAST MEDICAL HISTORY: Consistent with history of Parkinson's disease, history of sick sinus syndrome, status post pacemaker placement, coronary artery disease, status post bypass, history of hepatitis C, hyperlipidemia, anxiety.   ALLERGIES: CARBIDOPA AND PENICILLIN.   SOCIAL HISTORY: No smoking, no alcohol abuse. No illicit drug abuse. Lives at home with his wife.   FAMILY HISTORY: Both mother and father are deceased. Mother died from a stroke. Father died from lung cancer.   CURRENT MEDICATIONS: Aspirin 81 mg daily, clarithromycin 250 mg b.i.d., Colace 50 mg b.i.d. and Lasix 20 mg daily.   PHYSICAL EXAMINATION: Presently is as follows:  GENERAL: He is a lethargic-appearing male but in no apparent distress.  HEAD, EYES, EARS, NOSE, THROAT: He is atraumatic, normocephalic. Extraocular muscles are intact. Pupils equal and reactive to light. Sclerae anicteric. No conjunctival injection. No oropharyngeal erythema.  NECK: Supple. There is no jugular venous distention. No bruits, no lymphadenopathy, no thyromegaly.  HEART: Regular rate and rhythm. No murmurs. No rubs. No clicks.  LUNGS: Clear to auscultation bilaterally. No rales or rhonchi. No wheezes.  ABDOMEN: Soft, flat, nontender, nondistended. Has good bowel sounds. No hepatosplenomegaly appreciated.  EXTREMITIES: No evidence of any cyanosis or clubbing. He does have +1 pitting edema from the knees to ankles bilaterally, +2 pedal and radial pulses bilaterally.  NEUROLOGIC: He is alert, awake and oriented x 3, globally weak. Does have a baseline tremor. No other focal motor or sensory deficits appreciated bilaterally.  SKIN: Moist and warm with no rashes.  LYMPHATIC: There is no cervical or axillary  lymphadenopathy.   LABORATORY, DIAGNOSTIC AND RADIOLOGICAL DATA:    1.  Serum glucose of 170, BUN 17, creatinine 0.9, sodium 140, potassium 3.6, chloride 106, bicarb 30. Troponin 0.05. TSH 1.9.  Hemoglobin 12.8, hematocrit 38.8, white cell count 7.3. Urinalysis within normal limits.  2.  The patient did have a chest x-ray done which showed no evidence of acute cardiopulmonary disease.  3.  The patient had a CT of the head done which showed stable age-related cerebral atrophy, ventriculomegaly and no acute intracranial findings.   ASSESSMENT AND PLAN: This is an 81 year old male with a history of Parkinson's disease, history of sick sinus syndrome, status post pacemaker placement, coronary artery disease, status post bypass, history of hepatitis C, hyperlipidemia, anxiety. He presented to the hospital with generalized weakness and difficulty walking.  1.  Generalized weakness and difficulty walking. This is likely related to underlying deconditioning and also his Parkinson's getting worse. There is no evidence of any focal infectious or metabolic source for his weakness presently. His chest x-ray is negative except for some atelectasis. Urinalysis is negative. He does not have any renal failure or any electrolyte abnormalities. For now, we will keep him overnight under observation. We will get a physical therapy consult to assess his mobility. He may need placement to short-term rehab but will await further physical therapy evaluation.  2.  Parkinson's disease. His symptoms have been progressively getting worse. The patient follows with Dr. Cristopher PeruHemang Shah. He could not tolerate Sinemet in the past due to side effects and is hesitant to start any new medications. I will get a neurology consult.  3.  History of pacemaker placement due to sick sinus syndrome. The patient recently had his wire changed last week. The patient follows with Dr. Darrold JunkerParaschos. No acute issue related to this.  4.  History of hepatitis C. The patient's LFTs are stable. He is currently not on any treatment. There is no acute issue presently.  5.  CODE STATUS:  The patient is a full code.   TIME SPENT ON ADMISSION: 50 minutes.     ____________________________ Rolly PancakeVivek J. Cherlynn KaiserSainani, MD vjs:cs D: 05/23/2013 18:47:26 ET T: 05/23/2013 19:05:57 ET JOB#: [id number]  cc: Rolly PancakeVivek J. Cherlynn KaiserSainani, MD, <Dictator> Houston SirenVIVEK J SAINANI MD ELECTRONICALLY SIGNED 05/30/2013 18:47

## 2014-06-11 NOTE — Discharge Summary (Signed)
Dates of Admission and Diagnosis:  Date of Admission 23-May-2013   Date of Discharge 25-May-2013   Admitting Diagnosis weakness   Final Diagnosis 1. generalised weakness 2. parkinsons 3. SSS with PPM 4. Dementia    Chief Complaint/History of Present Illness CHIEF COMPLAINT: Generalized weakness and difficulty walking.   HISTORY OF PRESENT ILLNESS: This is an 81 year old male who was brought into the hospital by his wife due to difficulty ambulating and generalized weakness. The patient has a history of Parkinson's disease. He follows with Dr. Manuella Ghazi. The patient was started on Sinemet a while back, but had some side effects and could not tolerate it and, therefore, has not been taking anything. His parkinsonian symptoms have been progressively getting worse. Today, and also in the past few days, his wife says that he has had more difficulty getting up out of bed and doing his little activity. She was barely able to get him out of bed today and he was not able to stand up at all. Here in the Emergency Room, it took 2 people to get him up to the side of the bed. Due to his profound weakness and difficulty walking, hospitalist services were contacted for further treatment and evaluation.   Allergies:  PCN: Unknown  Carbidopa: Other  Thyroid:  05-Apr-15 16:16   Thyroid Stimulating Hormone 1.98 (0.45-4.50 (International Unit)  ----------------------- Pregnant patients have  different reference  ranges for TSH:  - - - - - - - - - -  Pregnant, first trimetser:  0.36 - 2.50 uIU/mL)  Cardiology:  05-Apr-15 15:25   Ventricular Rate 60  Atrial Rate 59  QRS Duration 170  QT 472  QTc 472  R Axis -86  T Axis 99  ECG interpretation Electronic ventricular pacemaker When compared with ECG of 17-May-2013 11:13, Vent. rate has decreased BY   5 BPM ----------unconfirmed---------- Confirmed by OVERREAD, NOT ([id number]), editor PEARSON, BARBARA ([id number]) on 05/24/2013 2:11:01 PM  Routine Chem:  05-Apr-15  16:16   Glucose, Serum  170  BUN 17  Creatinine (comp) 0.95  Sodium, Serum 140  Potassium, Serum 3.6  Chloride, Serum 106  CO2, Serum 30  Calcium (Total), Serum 8.6  Anion Gap  4  Osmolality (calc) 285  eGFR (African American) >60  eGFR (Non-African American) >60 (eGFR values <27m/min/1.73 m2 may be an indication of chronic kidney disease (CKD). Calculated eGFR is useful in patients with stable renal function. The eGFR calculation will not be reliable in acutely ill patients when serum creatinine is changing rapidly. It is not useful in  patients on dialysis. The eGFR calculation may not be applicable to patients at the low and high extremes of body sizes, pregnant women, and vegetarians.)  Cardiac:  05-Apr-15 16:16   Troponin I 0.05 (0.00-0.05 0.05 ng/mL or less: NEGATIVE  Repeat testing in 3-6 hrs  if clinically indicated. >0.05 ng/mL: POTENTIAL  MYOCARDIAL INJURY. Repeat  testing in 3-6 hrs if  clinically indicated. NOTE: An increase or decrease  of 30% or more on serial  testing suggests a  clinically important change)  Routine UA:  05-Apr-15 17:46   Color (UA) Yellow  Clarity (UA) Clear  Glucose (UA) 50 mg/dL  Bilirubin (UA) Negative  Ketones (UA) Negative  Specific Gravity (UA) 1.026  Blood (UA) Negative  pH (UA) 5.0  Protein (UA) 30 mg/dL  Nitrite (UA) Negative  Leukocyte Esterase (UA) Negative (Result(s) reported on 23 May 2013 at 06:14PM.)  RBC (UA) 2 /HPF  WBC (UA) 2 /HPF  Bacteria (  UA) NONE SEEN  Epithelial Cells (UA) <1 /HPF  Mucous (UA) PRESENT (Result(s) reported on 23 May 2013 at 06:14PM.)  Routine Hem:  05-Apr-15 16:16   WBC (CBC) 7.3  RBC (CBC)  4.09  Hemoglobin (CBC)  12.8  Hematocrit (CBC)  38.8  Platelet Count (CBC) 203 (Result(s) reported on 23 May 2013 at 04:32PM.)  MCV 95  MCH 31.4  MCHC 33.1  RDW  14.9   Pertinent Past History:  Pertinent Past History Parkinsons CVA   Hospital Course:  Hospital Course 81 yo male w/ hx of  Parkinson's, hx of SSS s/p pacemaker placement, CAD s/p CABG, hx of Hep C, Hyperlipidemia, anxiety came into hospital due to generalized weakness and difficulty walking.   1. Generalized weakness/difficulty walking - likely related to deconditioning and Parkinson's getting worse.   He dis have greater weakness LLE which is chronic Consulted neuro  2. Parkinson's disease - progressively getting worse.  Follows with DR. Manuella Ghazi.  - could not tolerate Sinemet due to side effects and hesitant to start new meds.  - Neuro consult obtained and advised OP f/u by Dr. Irish Elders.  3. hx of SSS s/p pacemaker - recently had a wire changed.   - follows w/ Dr. Josefa Half.   4. hx of Hep C - no acute issue.  On day of d/c Pt feels better although weak. Motor LLE 4/5 Alert oriented Lung sound clear  Time soent in discharge on d/c day 45 minutes   Condition on Discharge Fair   Code Status:  Code Status Full Code   DISCHARGE INSTRUCTIONS HOME MEDS:  Medication Reconciliation: Patient's Home Medications at Discharge:     Medication Instructions  omeprazole 20 mg oral delayed release tablet  1 tab(s) orally once a day, As Needed - for Indigestion, Heartburn   docusate sodium sodium 100 mg oral capsule  1 cap(s) orally 2 times a day as needed for constipation   furosemide 20 mg oral tablet  1 tab(s) orally once a day   alprazolam 0.5 mg oral tablet  0.5 tab(s) orally 2 times a day, As Needed   aspirin 81 mg oral delayed release tablet  1 tab(s) orally once a day    STOP TAKING THE FOLLOWING MEDICATION(S):    clarithromycin 250 mg oral tablet: 1 tab(s) orally 2 times a day x 10 days.  Physician's Instructions:  Home Health? Yes   [facility]  Nurse Aid  SW   Diet Regular   Dietary Supplements None   Activity Limitations As tolerated  with a walker   Return to Work Not Applicable   Time frame for Follow Up Appointment 1-2 weeks  Dr. Osborn Coho)   Time  frame for Follow Up Appointment 1-2 days  Has appt with Dr. Irwin Brakeman on 05/27/2013   Electronic Signatures: Alba Destine (MD)  (Signed 11-Apr-15 16:23)  Authored: ADMISSION DATE AND DIAGNOSIS, CHIEF COMPLAINT/HPI, Allergies, PERTINENT LABS, [place], PATIENT INSTRUCTIONS   Last Updated: 11-Apr-15 16:23 by Alba Destine (MD)

## 2015-02-05 IMAGING — CT CT HEAD WITHOUT CONTRAST
2 series · 14 of 30 positions shown, 16 images · non-contrast
Comparison: 05/31/2012.

CLINICAL DATA: Fell.  Hit head.

EXAM:
CT HEAD WITHOUT CONTRAST
TECHNIQUE: Contiguous axial images were obtained from the base of the skull
through the vertex without intravenous contrast.

[Series 2: head wo · axial · 0.47mm/px · z∈[-138,-39]mm · 6 of 32 slices shown, 8 images]
[im 5/32  brain]
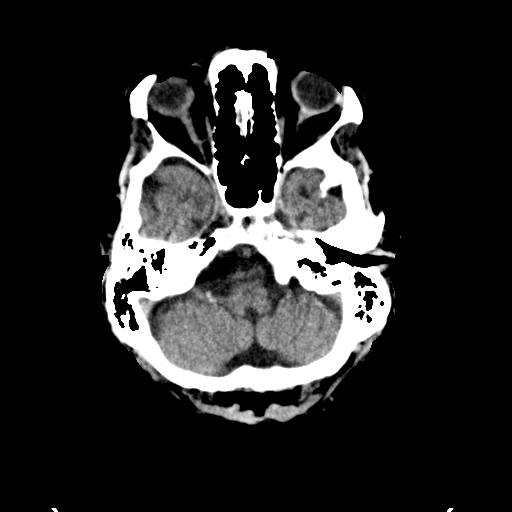
[im 5/32  bone]
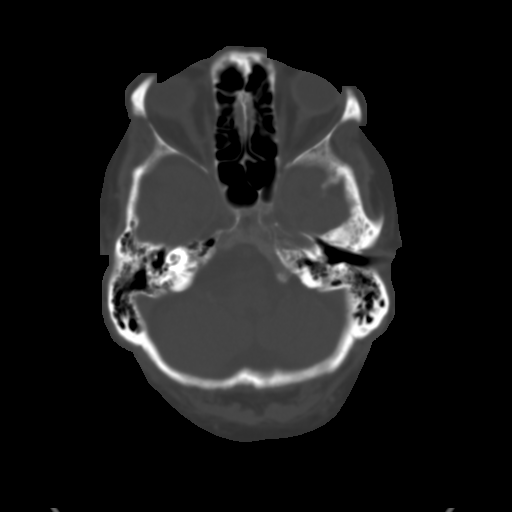
[im 9/32  brain]
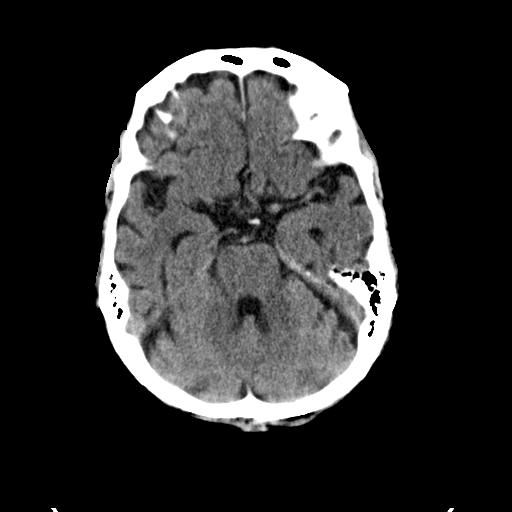
[im 14/32  brain]
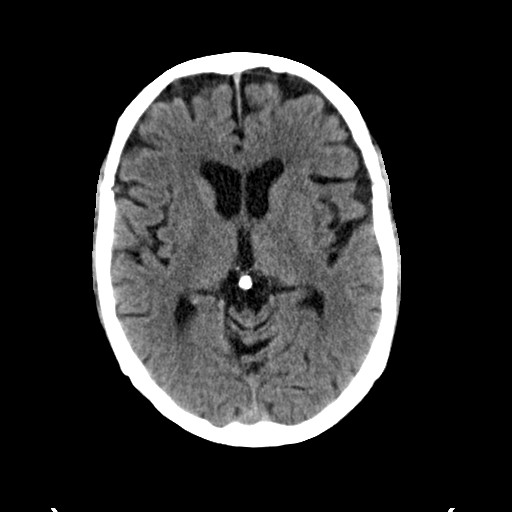
[im 18/32  brain]
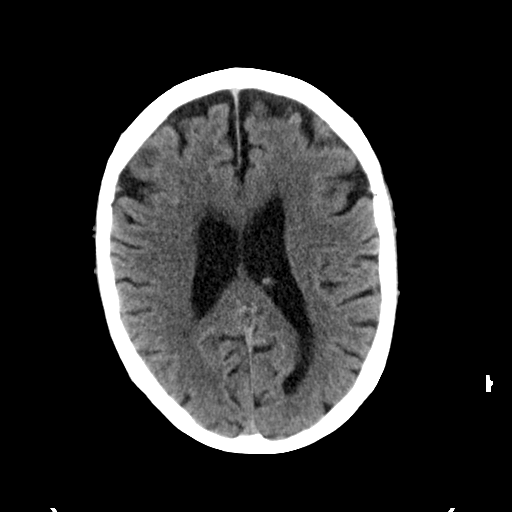
[im 23/32  brain]
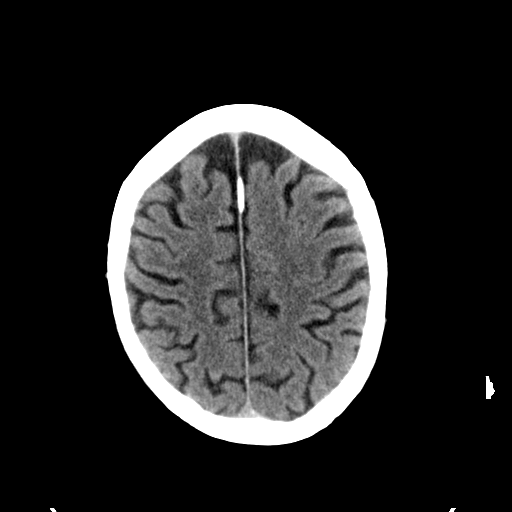
[im 23/32  bone]
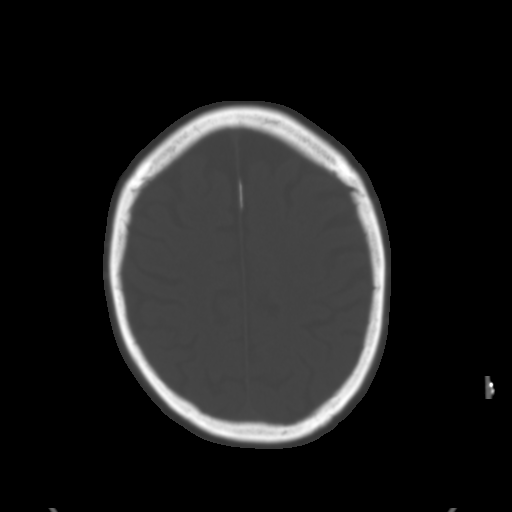
[im 27/32  brain]
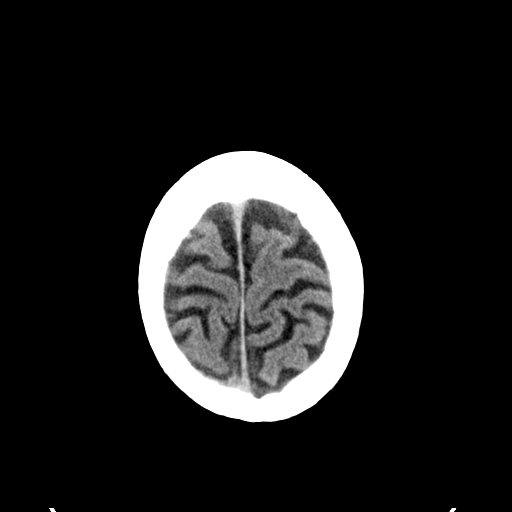

[Series 3: head bone · axial · 0.47mm/px · z∈[-144,-30]mm · 8 of 96 slices shown]
[im 10/96  bone]
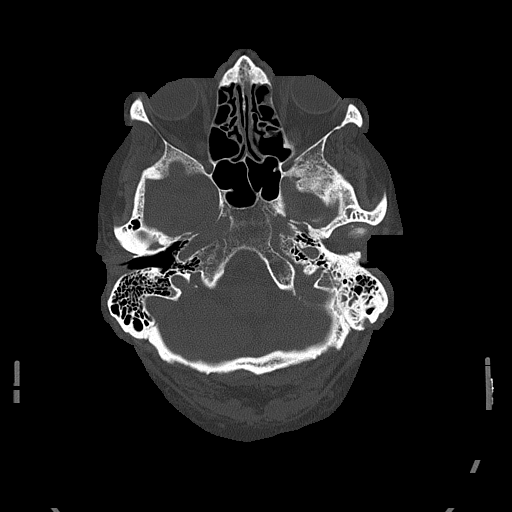
[im 19/96  bone]
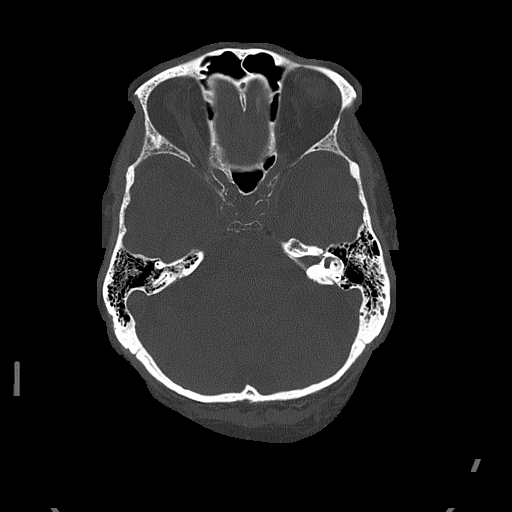
[im 32/96  bone]
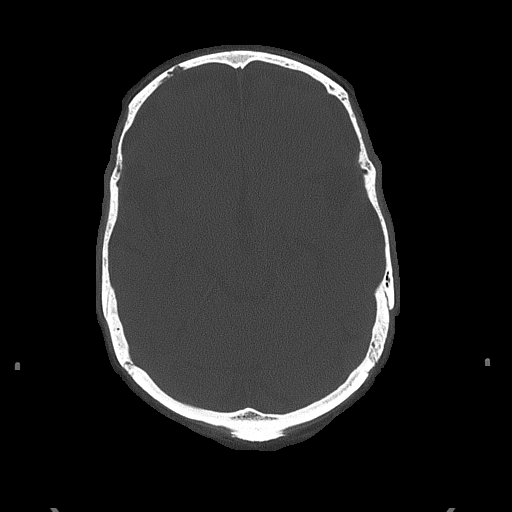
[im 41/96  bone]
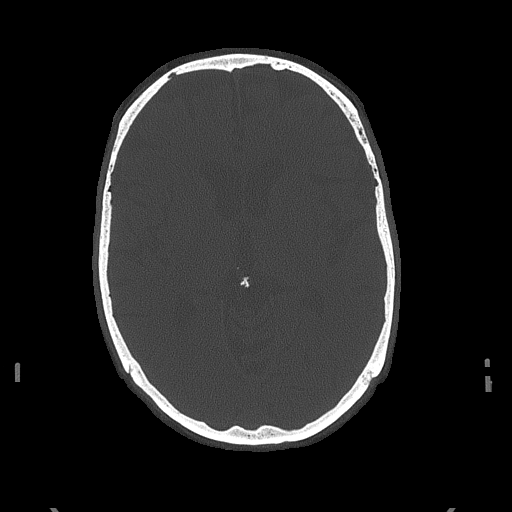
[im 55/96  bone]
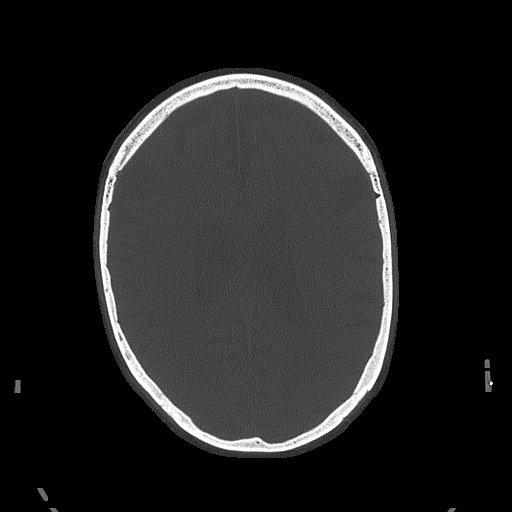
[im 64/96  bone]
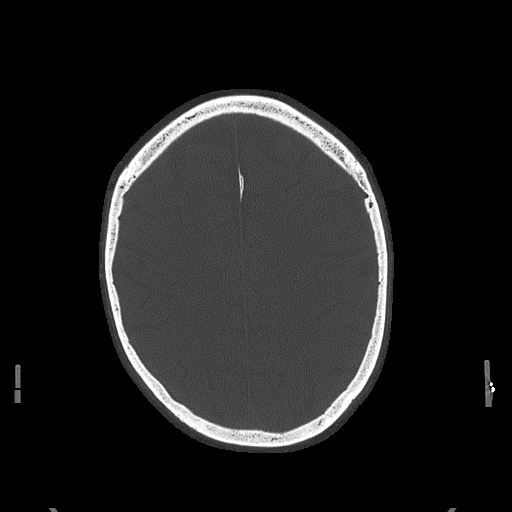
[im 77/96  bone]
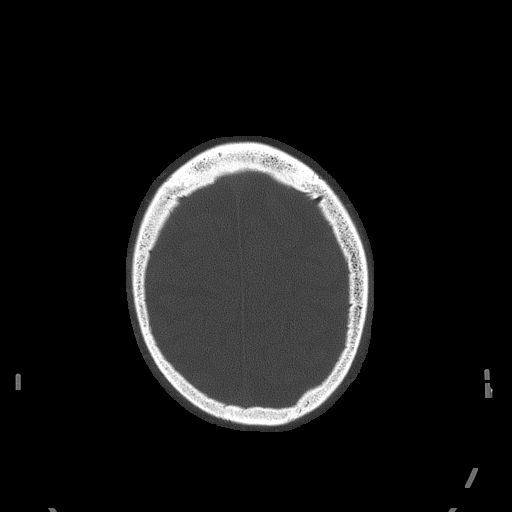
[im 86/96  bone]
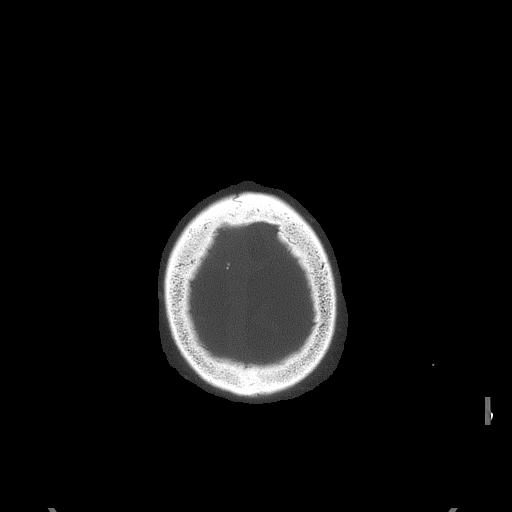

[14 of 30 positions shown; findings below may reference images not displayed]

FINDINGS: Stable age related cerebral atrophy, ventriculomegaly and
periventricular white matter disease. The ventricles are in the
midline without mass effect or shift. No extra-axial fluid
collections are identified. No CT findings for acute hemispheric
infarction or intracranial hemorrhage. No mass lesions are
identified. The brainstem and cerebellum are grossly normal.

The bony structures are intact. No acute skull fracture. The
paranasal sinuses and mastoid air cells are clear. The globes are
intact.
IMPRESSION: Stable age related cerebral atrophy, ventriculomegaly and
periventricular white matter disease

No acute intracranial findings are skull fracture.
